# Patient Record
Sex: Male | Born: 1960
Health system: Southern US, Community
[De-identification: ages and names within clinical notes are randomized; demographics above are authoritative.]

## PROBLEM LIST (undated history)

## (undated) DIAGNOSIS — G459 Transient cerebral ischemic attack, unspecified: Secondary | ICD-10-CM

## (undated) DIAGNOSIS — I10 Essential (primary) hypertension: Secondary | ICD-10-CM

## (undated) HISTORY — PX: BACK SURGERY: SHX140

---

## 2011-12-04 DIAGNOSIS — G894 Chronic pain syndrome: Secondary | ICD-10-CM | POA: Diagnosis not present

## 2011-12-04 DIAGNOSIS — E785 Hyperlipidemia, unspecified: Secondary | ICD-10-CM | POA: Diagnosis not present

## 2012-03-11 DIAGNOSIS — G894 Chronic pain syndrome: Secondary | ICD-10-CM | POA: Diagnosis not present

## 2012-03-11 DIAGNOSIS — Z79899 Other long term (current) drug therapy: Secondary | ICD-10-CM | POA: Diagnosis not present

## 2012-03-11 DIAGNOSIS — M79609 Pain in unspecified limb: Secondary | ICD-10-CM | POA: Diagnosis not present

## 2012-06-17 DIAGNOSIS — J45909 Unspecified asthma, uncomplicated: Secondary | ICD-10-CM | POA: Diagnosis not present

## 2012-06-17 DIAGNOSIS — Z1212 Encounter for screening for malignant neoplasm of rectum: Secondary | ICD-10-CM | POA: Diagnosis not present

## 2012-06-17 DIAGNOSIS — E785 Hyperlipidemia, unspecified: Secondary | ICD-10-CM | POA: Diagnosis not present

## 2012-06-17 DIAGNOSIS — G894 Chronic pain syndrome: Secondary | ICD-10-CM | POA: Diagnosis not present

## 2012-09-21 DIAGNOSIS — G894 Chronic pain syndrome: Secondary | ICD-10-CM | POA: Diagnosis not present

## 2012-12-29 DIAGNOSIS — G894 Chronic pain syndrome: Secondary | ICD-10-CM | POA: Diagnosis not present

## 2013-04-06 DIAGNOSIS — Z79899 Other long term (current) drug therapy: Secondary | ICD-10-CM | POA: Diagnosis not present

## 2013-04-06 DIAGNOSIS — G894 Chronic pain syndrome: Secondary | ICD-10-CM | POA: Diagnosis not present

## 2013-07-13 DIAGNOSIS — F411 Generalized anxiety disorder: Secondary | ICD-10-CM | POA: Diagnosis not present

## 2013-07-13 DIAGNOSIS — Z1212 Encounter for screening for malignant neoplasm of rectum: Secondary | ICD-10-CM | POA: Diagnosis not present

## 2013-07-13 DIAGNOSIS — G894 Chronic pain syndrome: Secondary | ICD-10-CM | POA: Diagnosis not present

## 2013-07-13 DIAGNOSIS — I10 Essential (primary) hypertension: Secondary | ICD-10-CM | POA: Diagnosis not present

## 2013-07-13 DIAGNOSIS — F339 Major depressive disorder, recurrent, unspecified: Secondary | ICD-10-CM | POA: Diagnosis not present

## 2013-07-13 DIAGNOSIS — E785 Hyperlipidemia, unspecified: Secondary | ICD-10-CM | POA: Diagnosis not present

## 2013-10-12 DIAGNOSIS — G894 Chronic pain syndrome: Secondary | ICD-10-CM | POA: Diagnosis not present

## 2014-01-12 DIAGNOSIS — G894 Chronic pain syndrome: Secondary | ICD-10-CM | POA: Diagnosis not present

## 2014-01-12 DIAGNOSIS — I10 Essential (primary) hypertension: Secondary | ICD-10-CM | POA: Diagnosis not present

## 2014-04-20 DIAGNOSIS — G894 Chronic pain syndrome: Secondary | ICD-10-CM | POA: Diagnosis not present

## 2014-04-20 DIAGNOSIS — I10 Essential (primary) hypertension: Secondary | ICD-10-CM | POA: Diagnosis not present

## 2014-07-24 DIAGNOSIS — Z79899 Other long term (current) drug therapy: Secondary | ICD-10-CM | POA: Diagnosis not present

## 2014-07-24 DIAGNOSIS — J309 Allergic rhinitis, unspecified: Secondary | ICD-10-CM | POA: Diagnosis not present

## 2014-07-24 DIAGNOSIS — G894 Chronic pain syndrome: Secondary | ICD-10-CM | POA: Diagnosis not present

## 2014-08-16 DIAGNOSIS — J0100 Acute maxillary sinusitis, unspecified: Secondary | ICD-10-CM | POA: Diagnosis not present

## 2014-08-16 DIAGNOSIS — J309 Allergic rhinitis, unspecified: Secondary | ICD-10-CM | POA: Diagnosis not present

## 2014-10-23 DIAGNOSIS — G894 Chronic pain syndrome: Secondary | ICD-10-CM | POA: Diagnosis not present

## 2015-01-23 DIAGNOSIS — Z79899 Other long term (current) drug therapy: Secondary | ICD-10-CM | POA: Diagnosis not present

## 2015-01-23 DIAGNOSIS — Z1212 Encounter for screening for malignant neoplasm of rectum: Secondary | ICD-10-CM | POA: Diagnosis not present

## 2015-01-23 DIAGNOSIS — R3911 Hesitancy of micturition: Secondary | ICD-10-CM | POA: Diagnosis not present

## 2015-01-23 DIAGNOSIS — E785 Hyperlipidemia, unspecified: Secondary | ICD-10-CM | POA: Diagnosis not present

## 2015-01-23 DIAGNOSIS — K219 Gastro-esophageal reflux disease without esophagitis: Secondary | ICD-10-CM | POA: Diagnosis not present

## 2015-01-23 DIAGNOSIS — I10 Essential (primary) hypertension: Secondary | ICD-10-CM | POA: Diagnosis not present

## 2015-01-23 DIAGNOSIS — J302 Other seasonal allergic rhinitis: Secondary | ICD-10-CM | POA: Diagnosis not present

## 2015-01-23 DIAGNOSIS — G894 Chronic pain syndrome: Secondary | ICD-10-CM | POA: Diagnosis not present

## 2015-04-26 DIAGNOSIS — M549 Dorsalgia, unspecified: Secondary | ICD-10-CM | POA: Diagnosis not present

## 2015-04-26 DIAGNOSIS — Z79899 Other long term (current) drug therapy: Secondary | ICD-10-CM | POA: Diagnosis not present

## 2015-04-26 DIAGNOSIS — G894 Chronic pain syndrome: Secondary | ICD-10-CM | POA: Diagnosis not present

## 2015-07-30 DIAGNOSIS — Z79899 Other long term (current) drug therapy: Secondary | ICD-10-CM | POA: Diagnosis not present

## 2015-07-30 DIAGNOSIS — G894 Chronic pain syndrome: Secondary | ICD-10-CM | POA: Diagnosis not present

## 2015-07-30 DIAGNOSIS — M545 Low back pain: Secondary | ICD-10-CM | POA: Diagnosis not present

## 2015-07-30 DIAGNOSIS — E782 Mixed hyperlipidemia: Secondary | ICD-10-CM | POA: Diagnosis not present

## 2015-07-30 DIAGNOSIS — I10 Essential (primary) hypertension: Secondary | ICD-10-CM | POA: Diagnosis not present

## 2015-07-30 DIAGNOSIS — Z23 Encounter for immunization: Secondary | ICD-10-CM | POA: Diagnosis not present

## 2015-11-02 DIAGNOSIS — G894 Chronic pain syndrome: Secondary | ICD-10-CM | POA: Diagnosis not present

## 2015-11-02 DIAGNOSIS — Z79899 Other long term (current) drug therapy: Secondary | ICD-10-CM | POA: Diagnosis not present

## 2015-12-26 DIAGNOSIS — G894 Chronic pain syndrome: Secondary | ICD-10-CM | POA: Diagnosis not present

## 2015-12-26 DIAGNOSIS — Z79899 Other long term (current) drug therapy: Secondary | ICD-10-CM | POA: Diagnosis not present

## 2016-02-05 DIAGNOSIS — Z1212 Encounter for screening for malignant neoplasm of rectum: Secondary | ICD-10-CM | POA: Diagnosis not present

## 2016-02-05 DIAGNOSIS — M549 Dorsalgia, unspecified: Secondary | ICD-10-CM | POA: Diagnosis not present

## 2016-02-05 DIAGNOSIS — Z79891 Long term (current) use of opiate analgesic: Secondary | ICD-10-CM | POA: Diagnosis not present

## 2016-02-14 DIAGNOSIS — K219 Gastro-esophageal reflux disease without esophagitis: Secondary | ICD-10-CM | POA: Diagnosis not present

## 2016-03-14 DIAGNOSIS — D122 Benign neoplasm of ascending colon: Secondary | ICD-10-CM | POA: Diagnosis not present

## 2016-03-14 DIAGNOSIS — D126 Benign neoplasm of colon, unspecified: Secondary | ICD-10-CM | POA: Diagnosis not present

## 2016-03-14 DIAGNOSIS — D124 Benign neoplasm of descending colon: Secondary | ICD-10-CM | POA: Diagnosis not present

## 2016-03-14 DIAGNOSIS — Z1211 Encounter for screening for malignant neoplasm of colon: Secondary | ICD-10-CM | POA: Diagnosis not present

## 2016-03-14 DIAGNOSIS — Z800 Family history of malignant neoplasm of digestive organs: Secondary | ICD-10-CM | POA: Diagnosis not present

## 2016-05-29 DIAGNOSIS — Z9181 History of falling: Secondary | ICD-10-CM | POA: Diagnosis not present

## 2016-05-29 DIAGNOSIS — M549 Dorsalgia, unspecified: Secondary | ICD-10-CM | POA: Diagnosis not present

## 2016-05-29 DIAGNOSIS — Z1389 Encounter for screening for other disorder: Secondary | ICD-10-CM | POA: Diagnosis not present

## 2016-09-15 DIAGNOSIS — Z23 Encounter for immunization: Secondary | ICD-10-CM | POA: Diagnosis not present

## 2016-09-15 DIAGNOSIS — M549 Dorsalgia, unspecified: Secondary | ICD-10-CM | POA: Diagnosis not present

## 2016-12-22 DIAGNOSIS — Z0000 Encounter for general adult medical examination without abnormal findings: Secondary | ICD-10-CM | POA: Diagnosis not present

## 2016-12-22 DIAGNOSIS — Z79899 Other long term (current) drug therapy: Secondary | ICD-10-CM | POA: Diagnosis not present

## 2016-12-22 DIAGNOSIS — Z79891 Long term (current) use of opiate analgesic: Secondary | ICD-10-CM | POA: Diagnosis not present

## 2016-12-22 DIAGNOSIS — E785 Hyperlipidemia, unspecified: Secondary | ICD-10-CM | POA: Diagnosis not present

## 2016-12-22 DIAGNOSIS — M549 Dorsalgia, unspecified: Secondary | ICD-10-CM | POA: Diagnosis not present

## 2016-12-22 DIAGNOSIS — Z125 Encounter for screening for malignant neoplasm of prostate: Secondary | ICD-10-CM | POA: Diagnosis not present

## 2016-12-22 DIAGNOSIS — Z23 Encounter for immunization: Secondary | ICD-10-CM | POA: Diagnosis not present

## 2017-04-09 DIAGNOSIS — Z6830 Body mass index (BMI) 30.0-30.9, adult: Secondary | ICD-10-CM | POA: Diagnosis not present

## 2017-04-09 DIAGNOSIS — M549 Dorsalgia, unspecified: Secondary | ICD-10-CM | POA: Diagnosis not present

## 2017-04-09 DIAGNOSIS — Z79899 Other long term (current) drug therapy: Secondary | ICD-10-CM | POA: Diagnosis not present

## 2017-07-15 DIAGNOSIS — Z6829 Body mass index (BMI) 29.0-29.9, adult: Secondary | ICD-10-CM | POA: Diagnosis not present

## 2017-07-15 DIAGNOSIS — M549 Dorsalgia, unspecified: Secondary | ICD-10-CM | POA: Diagnosis not present

## 2017-10-16 DIAGNOSIS — Z23 Encounter for immunization: Secondary | ICD-10-CM | POA: Diagnosis not present

## 2017-10-16 DIAGNOSIS — Z1331 Encounter for screening for depression: Secondary | ICD-10-CM | POA: Diagnosis not present

## 2017-10-16 DIAGNOSIS — Z9181 History of falling: Secondary | ICD-10-CM | POA: Diagnosis not present

## 2018-01-14 DIAGNOSIS — Z6829 Body mass index (BMI) 29.0-29.9, adult: Secondary | ICD-10-CM | POA: Diagnosis not present

## 2018-01-14 DIAGNOSIS — M549 Dorsalgia, unspecified: Secondary | ICD-10-CM | POA: Diagnosis not present

## 2018-04-19 DIAGNOSIS — Z6829 Body mass index (BMI) 29.0-29.9, adult: Secondary | ICD-10-CM | POA: Diagnosis not present

## 2018-04-19 DIAGNOSIS — M549 Dorsalgia, unspecified: Secondary | ICD-10-CM | POA: Diagnosis not present

## 2018-08-12 DIAGNOSIS — Z6830 Body mass index (BMI) 30.0-30.9, adult: Secondary | ICD-10-CM | POA: Diagnosis not present

## 2018-08-12 DIAGNOSIS — M549 Dorsalgia, unspecified: Secondary | ICD-10-CM | POA: Diagnosis not present

## 2018-08-12 DIAGNOSIS — Z23 Encounter for immunization: Secondary | ICD-10-CM | POA: Diagnosis not present

## 2018-11-16 DIAGNOSIS — Z1331 Encounter for screening for depression: Secondary | ICD-10-CM | POA: Diagnosis not present

## 2018-11-16 DIAGNOSIS — J4520 Mild intermittent asthma, uncomplicated: Secondary | ICD-10-CM | POA: Diagnosis not present

## 2018-11-16 DIAGNOSIS — E785 Hyperlipidemia, unspecified: Secondary | ICD-10-CM | POA: Diagnosis not present

## 2018-11-16 DIAGNOSIS — Z9181 History of falling: Secondary | ICD-10-CM | POA: Diagnosis not present

## 2018-11-16 DIAGNOSIS — Z125 Encounter for screening for malignant neoplasm of prostate: Secondary | ICD-10-CM | POA: Diagnosis not present

## 2019-08-10 DIAGNOSIS — Z9181 History of falling: Secondary | ICD-10-CM | POA: Diagnosis not present

## 2019-08-10 DIAGNOSIS — Z125 Encounter for screening for malignant neoplasm of prostate: Secondary | ICD-10-CM | POA: Diagnosis not present

## 2019-08-10 DIAGNOSIS — Z1331 Encounter for screening for depression: Secondary | ICD-10-CM | POA: Diagnosis not present

## 2019-08-10 DIAGNOSIS — Z6831 Body mass index (BMI) 31.0-31.9, adult: Secondary | ICD-10-CM | POA: Diagnosis not present

## 2019-08-10 DIAGNOSIS — Z0000 Encounter for general adult medical examination without abnormal findings: Secondary | ICD-10-CM | POA: Diagnosis not present

## 2019-08-10 DIAGNOSIS — E785 Hyperlipidemia, unspecified: Secondary | ICD-10-CM | POA: Diagnosis not present

## 2019-11-19 DIAGNOSIS — Z20828 Contact with and (suspected) exposure to other viral communicable diseases: Secondary | ICD-10-CM | POA: Diagnosis not present

## 2019-11-19 DIAGNOSIS — J3489 Other specified disorders of nose and nasal sinuses: Secondary | ICD-10-CM | POA: Diagnosis not present

## 2019-11-21 DIAGNOSIS — J019 Acute sinusitis, unspecified: Secondary | ICD-10-CM | POA: Diagnosis not present

## 2019-11-21 DIAGNOSIS — U071 COVID-19: Secondary | ICD-10-CM | POA: Diagnosis not present

## 2021-01-25 DIAGNOSIS — R270 Ataxia, unspecified: Secondary | ICD-10-CM | POA: Diagnosis not present

## 2021-01-25 DIAGNOSIS — R531 Weakness: Secondary | ICD-10-CM | POA: Diagnosis not present

## 2021-01-25 DIAGNOSIS — R42 Dizziness and giddiness: Secondary | ICD-10-CM | POA: Diagnosis present

## 2021-01-25 DIAGNOSIS — I639 Cerebral infarction, unspecified: Secondary | ICD-10-CM | POA: Diagnosis not present

## 2021-01-25 DIAGNOSIS — R29701 NIHSS score 1: Secondary | ICD-10-CM | POA: Diagnosis present

## 2021-01-25 DIAGNOSIS — J45909 Unspecified asthma, uncomplicated: Secondary | ICD-10-CM | POA: Diagnosis present

## 2021-01-25 DIAGNOSIS — G459 Transient cerebral ischemic attack, unspecified: Secondary | ICD-10-CM | POA: Diagnosis present

## 2021-01-25 DIAGNOSIS — I6523 Occlusion and stenosis of bilateral carotid arteries: Secondary | ICD-10-CM | POA: Diagnosis not present

## 2021-01-25 DIAGNOSIS — R29700 NIHSS score 0: Secondary | ICD-10-CM | POA: Diagnosis present

## 2021-01-25 DIAGNOSIS — H532 Diplopia: Secondary | ICD-10-CM | POA: Diagnosis present

## 2021-01-26 DIAGNOSIS — G459 Transient cerebral ischemic attack, unspecified: Secondary | ICD-10-CM | POA: Diagnosis not present

## 2021-02-01 DIAGNOSIS — E785 Hyperlipidemia, unspecified: Secondary | ICD-10-CM | POA: Diagnosis not present

## 2021-02-01 DIAGNOSIS — Z79899 Other long term (current) drug therapy: Secondary | ICD-10-CM | POA: Diagnosis not present

## 2021-02-01 DIAGNOSIS — I10 Essential (primary) hypertension: Secondary | ICD-10-CM | POA: Diagnosis not present

## 2021-02-01 DIAGNOSIS — Z9181 History of falling: Secondary | ICD-10-CM | POA: Diagnosis not present

## 2021-02-01 DIAGNOSIS — G459 Transient cerebral ischemic attack, unspecified: Secondary | ICD-10-CM | POA: Diagnosis not present

## 2021-02-01 DIAGNOSIS — R079 Chest pain, unspecified: Secondary | ICD-10-CM | POA: Diagnosis not present

## 2021-02-01 DIAGNOSIS — R739 Hyperglycemia, unspecified: Secondary | ICD-10-CM | POA: Diagnosis not present

## 2021-02-01 DIAGNOSIS — J309 Allergic rhinitis, unspecified: Secondary | ICD-10-CM | POA: Diagnosis not present

## 2021-02-01 DIAGNOSIS — Z1331 Encounter for screening for depression: Secondary | ICD-10-CM | POA: Diagnosis not present

## 2021-02-01 DIAGNOSIS — R5383 Other fatigue: Secondary | ICD-10-CM | POA: Diagnosis not present

## 2021-02-01 DIAGNOSIS — R5381 Other malaise: Secondary | ICD-10-CM | POA: Diagnosis not present

## 2021-02-01 DIAGNOSIS — Z6832 Body mass index (BMI) 32.0-32.9, adult: Secondary | ICD-10-CM | POA: Diagnosis not present

## 2021-02-02 ENCOUNTER — Other Ambulatory Visit: Payer: Self-pay

## 2021-02-02 ENCOUNTER — Encounter (HOSPITAL_COMMUNITY): Payer: Self-pay

## 2021-02-02 ENCOUNTER — Emergency Department (HOSPITAL_COMMUNITY)
Admission: EM | Admit: 2021-02-02 | Discharge: 2021-02-02 | Disposition: A | Discharge status: Home / Self Care | Payer: Medicare Other | Attending: Emergency Medicine | Admitting: Emergency Medicine

## 2021-02-02 ENCOUNTER — Emergency Department (HOSPITAL_COMMUNITY): Payer: Medicare Other

## 2021-02-02 DIAGNOSIS — Z8673 Personal history of transient ischemic attack (TIA), and cerebral infarction without residual deficits: Secondary | ICD-10-CM | POA: Diagnosis not present

## 2021-02-02 DIAGNOSIS — R42 Dizziness and giddiness: Secondary | ICD-10-CM | POA: Insufficient documentation

## 2021-02-02 DIAGNOSIS — R61 Generalized hyperhidrosis: Secondary | ICD-10-CM | POA: Insufficient documentation

## 2021-02-02 DIAGNOSIS — R55 Syncope and collapse: Secondary | ICD-10-CM | POA: Diagnosis not present

## 2021-02-02 DIAGNOSIS — R519 Headache, unspecified: Secondary | ICD-10-CM | POA: Diagnosis not present

## 2021-02-02 DIAGNOSIS — I10 Essential (primary) hypertension: Secondary | ICD-10-CM | POA: Diagnosis not present

## 2021-02-02 DIAGNOSIS — R109 Unspecified abdominal pain: Secondary | ICD-10-CM | POA: Insufficient documentation

## 2021-02-02 HISTORY — DX: Transient cerebral ischemic attack, unspecified: G45.9

## 2021-02-02 HISTORY — DX: Essential (primary) hypertension: I10

## 2021-02-02 LAB — COMPREHENSIVE METABOLIC PANEL
ALT: 21 U/L (ref 0–44)
AST: 21 U/L (ref 15–41)
Albumin: 4.1 g/dL (ref 3.5–5.0)
Alkaline Phosphatase: 62 U/L (ref 38–126)
Anion gap: 9 (ref 5–15)
BUN: 13 mg/dL (ref 6–20)
CO2: 25 mmol/L (ref 22–32)
Calcium: 9.2 mg/dL (ref 8.9–10.3)
Chloride: 104 mmol/L (ref 98–111)
Creatinine, Ser: 0.9 mg/dL (ref 0.61–1.24)
GFR, Estimated: >60 mL/min (ref >60)
Glucose, Bld: 103 mg/dL — ABNORMAL HIGH (ref 70–99)
Potassium: 4.2 mmol/L (ref 3.5–5.1)
Sodium: 138 mmol/L (ref 135–145)
Total Bilirubin: 0.6 mg/dL (ref 0.3–1.2)
Total Protein: 7 g/dL (ref 6.5–8.1)

## 2021-02-02 LAB — URINALYSIS, ROUTINE W REFLEX MICROSCOPIC
Bilirubin Urine: NEGATIVE
Glucose, UA: NEGATIVE mg/dL
Hgb urine dipstick: NEGATIVE
Ketones, ur: NEGATIVE mg/dL
Leukocytes,Ua: NEGATIVE
Nitrite: NEGATIVE
Protein, ur: NEGATIVE mg/dL
Specific Gravity, Urine: 1.014 (ref 1.005–1.030)
pH: 5 (ref 5.0–8.0)

## 2021-02-02 LAB — TROPONIN I (HIGH SENSITIVITY)
Troponin I (High Sensitivity): 2 ng/L (ref <18)
Troponin I (High Sensitivity): 3 ng/L (ref <18)

## 2021-02-02 LAB — CBC
HCT: 49.9 % (ref 39.0–52.0)
Hemoglobin: 17.1 g/dL — ABNORMAL HIGH (ref 13.0–17.0)
MCH: 31.7 pg (ref 26.0–34.0)
MCHC: 34.3 g/dL (ref 30.0–36.0)
MCV: 92.6 fL (ref 80.0–100.0)
Platelets: 275 10*3/uL (ref 150–400)
RBC: 5.39 MIL/uL (ref 4.22–5.81)
RDW: 12.3 % (ref 11.5–15.5)
WBC: 4.6 10*3/uL (ref 4.0–10.5)
nRBC: 0 % (ref 0.0–0.2)

## 2021-02-02 MED ORDER — LORAZEPAM 2 MG/ML IJ SOLN
2.0000 mg | Freq: Once | INTRAMUSCULAR | Status: AC
  Administered 2021-02-02: 2 mg via INTRAVENOUS
  Filled 2021-02-02: qty 1

## 2021-02-02 NOTE — ED Triage Notes (Signed)
Pt reports that last Friday, became dizzy, and had trouble seeing. Went to [facility] and was diagnosed with a TIA - This morning around 0300 am started having nausea and became diaphoretic. BP was 174/102 at home. Nausea and sweating has resolved. Took a new BP prescription yesterday - olmesartan mepaxami.

## 2021-02-02 NOTE — ED Provider Notes (Signed)
[facility] EMERGENCY DEPARTMENT Provider Note   CSN: [id number] Arrival date & time: 02/02/21  0536     History No chief complaint on file.   Phillip Byrd is a 60 y.o. male.  Dizziness described as near syncopal versus vertiginous.  Went to outside facility was told he may have had a mini stroke but was unable to get MRI due to anxiety at the time.  Was sent home with antihypertensives and no other medications.  Still intermittently having symptoms.  Also accompanied with the symptoms are some abdominal discomfort diaphoresis.  Normal urination normal bowel movement.  No recent illness.  Intermittent headaches.  Mild to moderate.  Symptoms are better than they were but still intermittently persisting  The history is provided by the patient.       Past Medical History:  Diagnosis Date  . Hypertension   . TIA (transient ischemic attack)     There are no problems to display for this patient.   Past Surgical History:  Procedure Laterality Date  . BACK SURGERY         History reviewed. No pertinent family history.  Social History   Tobacco Use  . Smoking status: Never Smoker  . Smokeless tobacco: Never Used  Vaping Use  . Vaping Use: Never used  Substance Use Topics  . Alcohol use: Not Currently  . Drug use: Not Currently    Home Medications Prior to Admission medications   Not on File    Allergies    Codeine  Review of Systems   Review of Systems  Constitutional: Positive for chills and diaphoresis. Negative for fever.  HENT: Negative for congestion and rhinorrhea.   Respiratory: Negative for cough and shortness of breath.   Cardiovascular: Negative for chest pain and palpitations.  Gastrointestinal: Positive for abdominal pain. Negative for diarrhea, nausea and vomiting.  Genitourinary: Negative for difficulty urinating and dysuria.  Musculoskeletal: Negative for arthralgias and back pain.  Skin: Negative for color change and  rash.  Neurological: Positive for dizziness and headaches. Negative for light-headedness.    Physical Exam Updated Vital Signs BP 138/78   Pulse 74   Temp 98.1 F (36.7 C) (Oral)   Resp 19   Ht 5\' 11"  (1.803 m)   Wt 95.3 kg   SpO2 100%   BMI 29.29 kg/m   Physical Exam Vitals and nursing note reviewed. Exam conducted with a chaperone present.  Constitutional:      General: He is not in acute distress.    Appearance: Normal appearance.  HENT:     Head: Normocephalic and atraumatic.     Nose: No rhinorrhea.  Eyes:     General:        Right eye: No discharge.        Left eye: No discharge.     Conjunctiva/sclera: Conjunctivae normal.  Cardiovascular:     Rate and Rhythm: Normal rate and regular rhythm.  Pulmonary:     Effort: Pulmonary effort is normal.     Breath sounds: No stridor.  Abdominal:     General: Abdomen is flat. There is no distension.     Palpations: Abdomen is soft.  Musculoskeletal:        General: No deformity or signs of injury.  Skin:    General: Skin is warm and dry.  Neurological:     General: No focal deficit present.     Mental Status: He is alert. Mental status is at baseline.  Motor: No weakness.     Comments: 5 out of 5 motor strength in all extremities, sensation intact throughout, no dysmetria, no dysdiadochokinesia, no ataxia with ambulation, cranial nerves II through XII intact, alert and oriented to person place and time   Psychiatric:        Mood and Affect: Mood normal.        Behavior: Behavior normal.        Thought Content: Thought content normal.     ED Results / Procedures / Treatments   Labs (all labs ordered are listed, but only abnormal results are displayed) Labs Reviewed  COMPREHENSIVE METABOLIC PANEL - Abnormal; Notable for the following components:      Result Value   Glucose, Bld 103 (*)    All other components within normal limits  CBC - Abnormal; Notable for the following components:   Hemoglobin 17.1 (*)     All other components within normal limits  URINALYSIS, ROUTINE W REFLEX MICROSCOPIC  TROPONIN I (HIGH SENSITIVITY)  TROPONIN I (HIGH SENSITIVITY)    EKG None  Radiology MR BRAIN WO CONTRAST  Result Date: 02/02/2021 CLINICAL DATA:  TIA.  Dizziness. EXAM: MRI HEAD WITHOUT CONTRAST TECHNIQUE: Multiplanar, multiecho pulse sequences of the brain and surrounding structures were obtained without intravenous contrast. COMPARISON:  CTA of the head neck from 8 days ago FINDINGS: Brain: No acute infarction, hemorrhage, hydrocephalus, extra-axial collection or mass lesion. Age normal brain volume. Scant FLAIR hyperintensity in the white matter, non worrisome for age. Vascular: Normal flow voids. Skull and upper cervical spine: Normal marrow signal. Sinuses/Orbits: Negative. IMPRESSION: No acute or subacute infarct.  No explanation for symptoms. Electronically Signed   By: Monte Fantasia M.D.   On: 02/02/2021 09:36    Procedures Procedures   Medications Ordered in ED Medications  LORazepam (ATIVAN) injection 2 mg (2 mg Intravenous Given 02/02/21 6269)    ED Course  I have reviewed the triage vital signs and the nursing notes.  Pertinent labs & imaging results that were available during my care of the patient were reviewed by me and considered in my medical decision making (see chart for details).    MDM Rules/Calculators/A&P                          Recent work-up for TIA versus stroke.  Unable to get MRI.  Will get MRI here today.  Will get screening labs.  Still having intermittent symptoms of dizziness that are not provoked by position change.  Will get screening cardiac biomarkers EKG as well.  Patient is overall well-appearing with normal neurologic exam today.  Will be given Ativan for MRI.  MRI reviewed by radiology is unremarkable.  Patient remains hemodynamically stable.  Remains with a normal neurologic exam.  2 times troponins negative.  EKG is unremarkable.  He is overall  well-appearing and feels comfortable outpatient management.  Strict return precautions discussed.  No very obvious cause of dizziness now possibly peripheral.  Will follow up with Dr. And return as needed.  Final Clinical Impression(s) / ED Diagnoses Final diagnoses:  Dizziness    Rx / DC Orders ED Discharge Orders    None       Breck Coons, MD 02/02/21 1134

## 2021-02-02 NOTE — ED Notes (Signed)
Patient transported to MRI 

## 2021-02-04 ENCOUNTER — Other Ambulatory Visit: Payer: Self-pay | Admitting: Internal Medicine

## 2021-02-04 DIAGNOSIS — R42 Dizziness and giddiness: Secondary | ICD-10-CM | POA: Diagnosis not present

## 2021-02-04 DIAGNOSIS — G459 Transient cerebral ischemic attack, unspecified: Secondary | ICD-10-CM

## 2021-02-04 DIAGNOSIS — F411 Generalized anxiety disorder: Secondary | ICD-10-CM | POA: Diagnosis not present

## 2021-02-04 DIAGNOSIS — I10 Essential (primary) hypertension: Secondary | ICD-10-CM | POA: Diagnosis not present

## 2021-02-09 DIAGNOSIS — R42 Dizziness and giddiness: Secondary | ICD-10-CM | POA: Diagnosis not present

## 2021-02-11 DIAGNOSIS — R079 Chest pain, unspecified: Secondary | ICD-10-CM | POA: Diagnosis not present

## 2021-02-11 DIAGNOSIS — R110 Nausea: Secondary | ICD-10-CM | POA: Diagnosis not present

## 2021-02-11 DIAGNOSIS — Z23 Encounter for immunization: Secondary | ICD-10-CM | POA: Diagnosis not present

## 2021-02-11 DIAGNOSIS — R0602 Shortness of breath: Secondary | ICD-10-CM | POA: Diagnosis not present

## 2021-02-11 DIAGNOSIS — R61 Generalized hyperhidrosis: Secondary | ICD-10-CM | POA: Diagnosis not present

## 2021-02-11 DIAGNOSIS — I208 Other forms of angina pectoris: Secondary | ICD-10-CM | POA: Diagnosis not present

## 2021-02-11 DIAGNOSIS — I10 Essential (primary) hypertension: Secondary | ICD-10-CM | POA: Diagnosis not present

## 2021-02-11 DIAGNOSIS — R42 Dizziness and giddiness: Secondary | ICD-10-CM | POA: Diagnosis not present

## 2021-02-11 DIAGNOSIS — R1013 Epigastric pain: Secondary | ICD-10-CM | POA: Diagnosis not present

## 2021-02-11 DIAGNOSIS — K219 Gastro-esophageal reflux disease without esophagitis: Secondary | ICD-10-CM | POA: Diagnosis not present

## 2021-02-12 DIAGNOSIS — R0602 Shortness of breath: Secondary | ICD-10-CM | POA: Diagnosis not present

## 2021-02-12 DIAGNOSIS — R42 Dizziness and giddiness: Secondary | ICD-10-CM | POA: Diagnosis not present

## 2021-02-12 DIAGNOSIS — R079 Chest pain, unspecified: Secondary | ICD-10-CM | POA: Diagnosis not present

## 2021-02-14 DIAGNOSIS — Z79899 Other long term (current) drug therapy: Secondary | ICD-10-CM | POA: Diagnosis not present

## 2021-02-14 DIAGNOSIS — R42 Dizziness and giddiness: Secondary | ICD-10-CM | POA: Diagnosis not present

## 2021-02-14 DIAGNOSIS — I10 Essential (primary) hypertension: Secondary | ICD-10-CM | POA: Diagnosis not present

## 2021-02-14 DIAGNOSIS — R079 Chest pain, unspecified: Secondary | ICD-10-CM | POA: Diagnosis not present

## 2021-02-14 DIAGNOSIS — F411 Generalized anxiety disorder: Secondary | ICD-10-CM | POA: Diagnosis not present

## 2021-02-14 DIAGNOSIS — Z6831 Body mass index (BMI) 31.0-31.9, adult: Secondary | ICD-10-CM | POA: Diagnosis not present

## 2021-02-14 DIAGNOSIS — E785 Hyperlipidemia, unspecified: Secondary | ICD-10-CM | POA: Diagnosis not present

## 2021-02-18 DIAGNOSIS — F411 Generalized anxiety disorder: Secondary | ICD-10-CM | POA: Diagnosis not present

## 2021-02-18 DIAGNOSIS — I10 Essential (primary) hypertension: Secondary | ICD-10-CM | POA: Diagnosis not present

## 2021-02-28 DIAGNOSIS — R079 Chest pain, unspecified: Secondary | ICD-10-CM | POA: Diagnosis not present

## 2021-02-28 DIAGNOSIS — E782 Mixed hyperlipidemia: Secondary | ICD-10-CM | POA: Diagnosis not present

## 2021-03-01 DIAGNOSIS — R079 Chest pain, unspecified: Secondary | ICD-10-CM | POA: Diagnosis not present

## 2021-03-08 DIAGNOSIS — R42 Dizziness and giddiness: Secondary | ICD-10-CM | POA: Diagnosis not present

## 2021-03-08 DIAGNOSIS — I10 Essential (primary) hypertension: Secondary | ICD-10-CM | POA: Diagnosis not present

## 2021-03-08 DIAGNOSIS — Z6832 Body mass index (BMI) 32.0-32.9, adult: Secondary | ICD-10-CM | POA: Diagnosis not present

## 2021-03-08 DIAGNOSIS — F411 Generalized anxiety disorder: Secondary | ICD-10-CM | POA: Diagnosis not present

## 2021-03-19 DIAGNOSIS — Z6832 Body mass index (BMI) 32.0-32.9, adult: Secondary | ICD-10-CM | POA: Diagnosis not present

## 2021-03-19 DIAGNOSIS — L02415 Cutaneous abscess of right lower limb: Secondary | ICD-10-CM | POA: Diagnosis not present

## 2021-03-28 DIAGNOSIS — L02415 Cutaneous abscess of right lower limb: Secondary | ICD-10-CM | POA: Diagnosis not present

## 2021-05-14 DIAGNOSIS — I10 Essential (primary) hypertension: Secondary | ICD-10-CM | POA: Diagnosis not present

## 2021-05-14 DIAGNOSIS — Z1211 Encounter for screening for malignant neoplasm of colon: Secondary | ICD-10-CM | POA: Diagnosis not present

## 2021-05-14 DIAGNOSIS — E785 Hyperlipidemia, unspecified: Secondary | ICD-10-CM | POA: Diagnosis not present

## 2021-05-14 DIAGNOSIS — F411 Generalized anxiety disorder: Secondary | ICD-10-CM | POA: Diagnosis not present

## 2021-05-14 DIAGNOSIS — Z6832 Body mass index (BMI) 32.0-32.9, adult: Secondary | ICD-10-CM | POA: Diagnosis not present

## 2021-05-14 DIAGNOSIS — R42 Dizziness and giddiness: Secondary | ICD-10-CM | POA: Diagnosis not present

## 2021-05-14 DIAGNOSIS — L02415 Cutaneous abscess of right lower limb: Secondary | ICD-10-CM | POA: Diagnosis not present

## 2021-05-17 DIAGNOSIS — E785 Hyperlipidemia, unspecified: Secondary | ICD-10-CM | POA: Diagnosis not present

## 2021-05-17 DIAGNOSIS — I10 Essential (primary) hypertension: Secondary | ICD-10-CM | POA: Diagnosis not present

## 2021-08-26 DIAGNOSIS — F411 Generalized anxiety disorder: Secondary | ICD-10-CM | POA: Diagnosis not present

## 2021-08-26 DIAGNOSIS — E785 Hyperlipidemia, unspecified: Secondary | ICD-10-CM | POA: Diagnosis not present

## 2021-08-26 DIAGNOSIS — I10 Essential (primary) hypertension: Secondary | ICD-10-CM | POA: Diagnosis not present

## 2021-08-26 DIAGNOSIS — Z6831 Body mass index (BMI) 31.0-31.9, adult: Secondary | ICD-10-CM | POA: Diagnosis not present

## 2022-02-21 DIAGNOSIS — Z1331 Encounter for screening for depression: Secondary | ICD-10-CM | POA: Diagnosis not present

## 2022-02-21 DIAGNOSIS — I10 Essential (primary) hypertension: Secondary | ICD-10-CM | POA: Diagnosis not present

## 2022-02-21 DIAGNOSIS — J4520 Mild intermittent asthma, uncomplicated: Secondary | ICD-10-CM | POA: Diagnosis not present

## 2022-02-21 DIAGNOSIS — E785 Hyperlipidemia, unspecified: Secondary | ICD-10-CM | POA: Diagnosis not present

## 2022-02-21 DIAGNOSIS — F411 Generalized anxiety disorder: Secondary | ICD-10-CM | POA: Diagnosis not present

## 2022-08-25 DIAGNOSIS — M549 Dorsalgia, unspecified: Secondary | ICD-10-CM | POA: Diagnosis not present

## 2022-08-25 DIAGNOSIS — J4520 Mild intermittent asthma, uncomplicated: Secondary | ICD-10-CM | POA: Diagnosis not present

## 2022-08-25 DIAGNOSIS — I10 Essential (primary) hypertension: Secondary | ICD-10-CM | POA: Diagnosis not present

## 2022-08-25 DIAGNOSIS — L739 Follicular disorder, unspecified: Secondary | ICD-10-CM | POA: Diagnosis not present

## 2022-08-25 DIAGNOSIS — G8929 Other chronic pain: Secondary | ICD-10-CM | POA: Diagnosis not present

## 2022-08-25 DIAGNOSIS — E785 Hyperlipidemia, unspecified: Secondary | ICD-10-CM | POA: Diagnosis not present

## 2022-10-28 DIAGNOSIS — J019 Acute sinusitis, unspecified: Secondary | ICD-10-CM | POA: Diagnosis not present

## 2022-12-15 DIAGNOSIS — M79604 Pain in right leg: Secondary | ICD-10-CM | POA: Diagnosis not present

## 2022-12-15 DIAGNOSIS — S8011XA Contusion of right lower leg, initial encounter: Secondary | ICD-10-CM | POA: Diagnosis not present

## 2022-12-28 IMAGING — MR MR HEAD W/O CM
12 of 13 series · 44 of 48 positions shown · non-contrast
Comparison: CTA of the head neck from 8 days ago

CLINICAL DATA: TIA.  Dizziness.

EXAM:
MRI HEAD WITHOUT CONTRAST
TECHNIQUE: Multiplanar, multiecho pulse sequences of the brain and surrounding
structures were obtained without intravenous contrast.

[Series 5: DWI · axial · 3.0mm · 0.88mm/px · z∈[-113,+32]mm · 8 of 100 slices shown (1 of 4)]
[im 1/100]
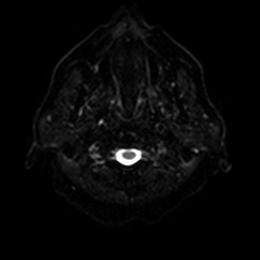
[im 15/100]
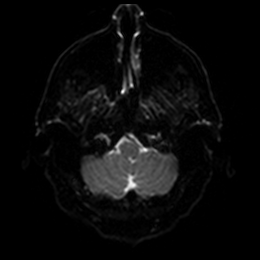
[im 29/100]
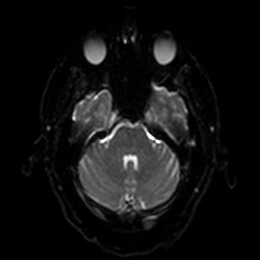
[im 43/100]
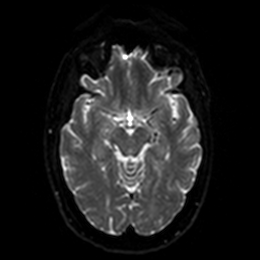
[im 57/100]
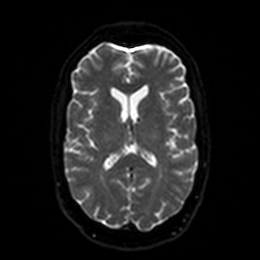
[im 71/100]
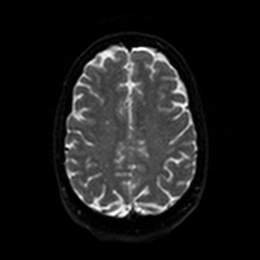
[im 85/100]
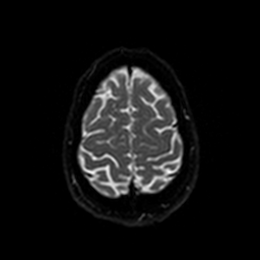
[im 100/100]
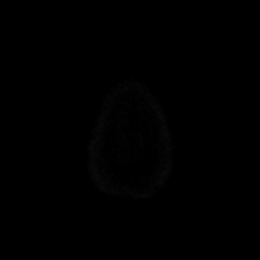

[Series 6: DWI · axial · 3.0mm · 0.88mm/px · z∈[-113,+32]mm · 4 of 50 slices shown (2 of 4)]
[im 1/50]
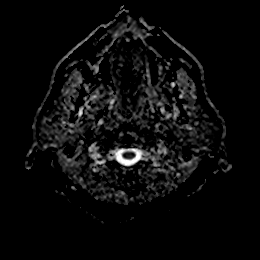
[im 17/50]
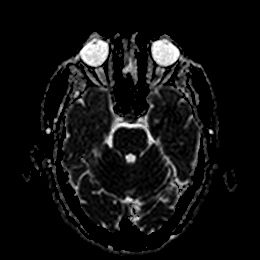
[im 33/50]
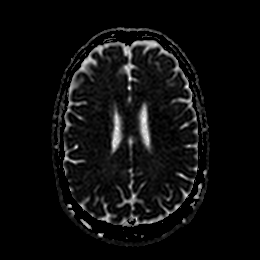
[im 50/50]
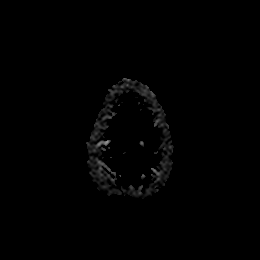

[Series 7: DWI · coronal · 4.0mm · 0.88mm/px · 5 of 68 slices shown (3 of 4)]
[im 1/68]
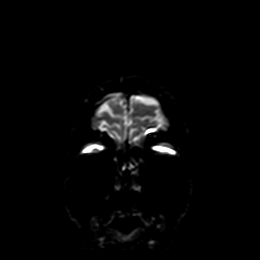
[im 17/68]
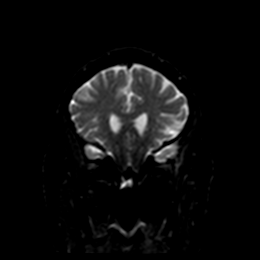
[im 34/68]
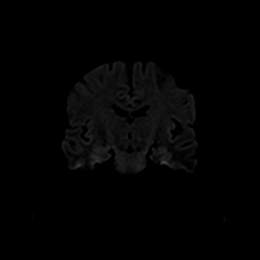
[im 51/68]
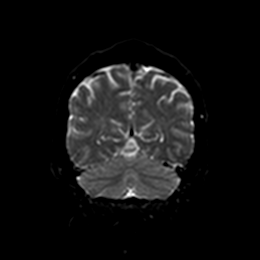
[im 68/68]
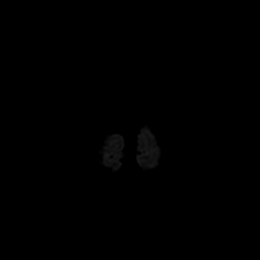

[Series 8: DWI · coronal · 4.0mm · 0.88mm/px · 3 of 34 slices shown (4 of 4)]
[im 1/34]
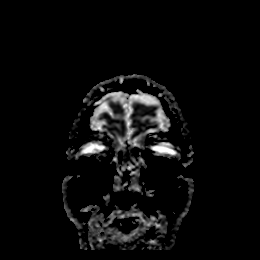
[im 17/34]
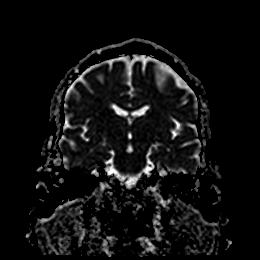
[im 34/34]
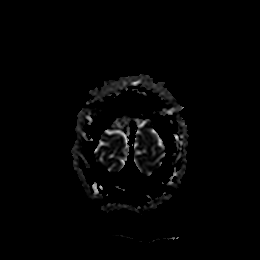

[Series 9: T1 · sagittal · 5.0mm · 0.75mm/px · 2 of 23 slices shown]
[im 1/23]
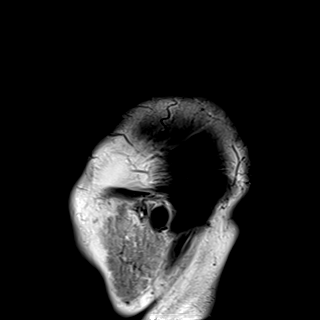
[im 23/23]
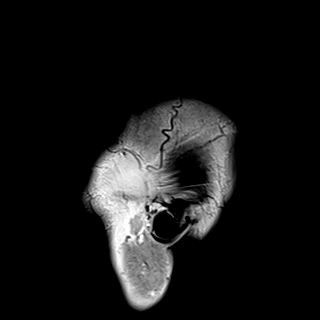

[Series 10: T2 · axial · 5.0mm · 0.72mm/px · z∈[-129,+22]mm · 2 of 27 slices shown (1 of 2)]
[im 1/27]
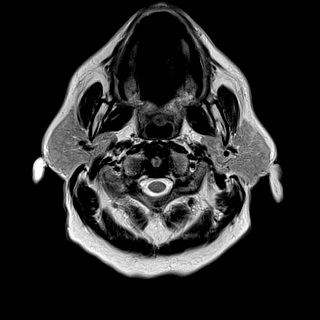
[im 27/27]
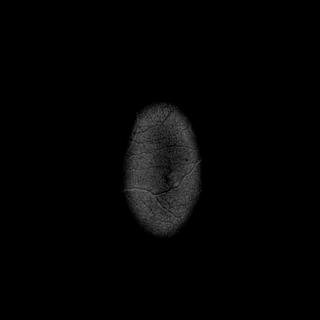

[Series 11: FLAIR · axial · 5.0mm · 0.45mm/px · z∈[-129,+22]mm · 2 of 27 slices shown]
[im 1/27]
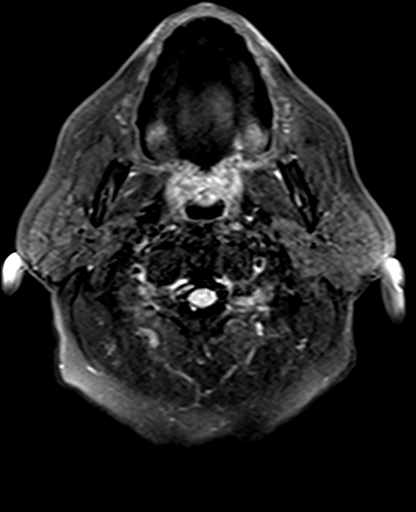
[im 27/27]
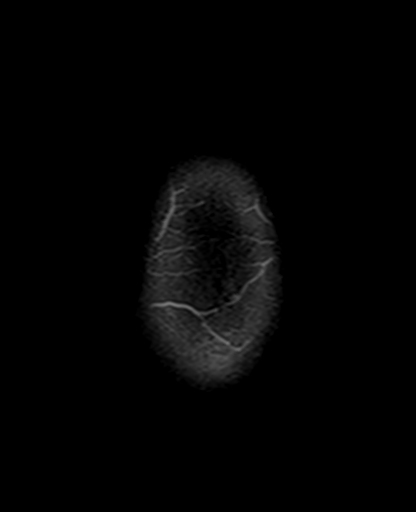

[Series 12: mag_images · axial · 3.0mm · 0.90mm/px · z∈[-131,+29]mm · 4 of 56 slices shown]
[im 1/56]
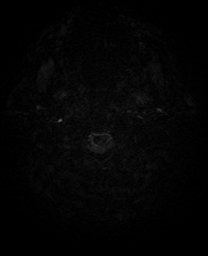
[im 19/56]
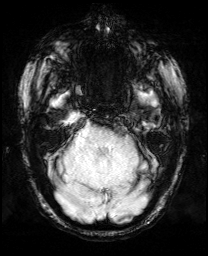
[im 37/56]
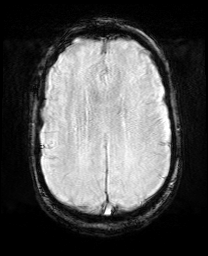
[im 56/56]
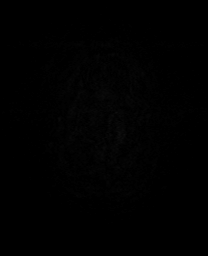

[Series 13: pha_images · axial · 3.0mm · 0.90mm/px · z∈[-128,+26]mm · 4 of 54 slices shown]
[im 1/54]
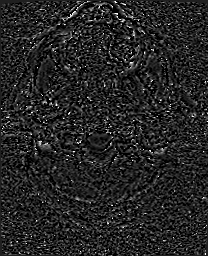
[im 18/54]
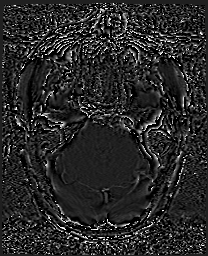
[im 36/54]
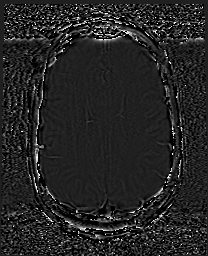
[im 54/54]
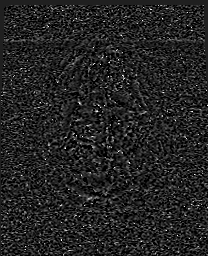

[Series 14: swi_images · axial · 3.0mm · 0.90mm/px · z∈[-131,+29]mm · 4 of 56 slices shown]
[im 1/56]
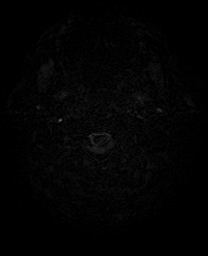
[im 19/56]
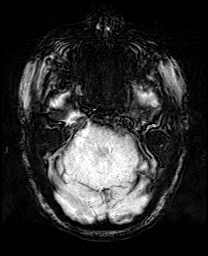
[im 37/56]
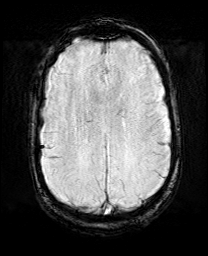
[im 56/56]
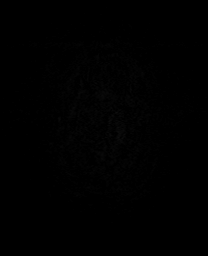

[Series 15: mip_images(sw) · axial · 24.0mm · 0.90mm/px · z∈[-120,+19]mm · 4 of 49 slices shown]
[im 1/49]
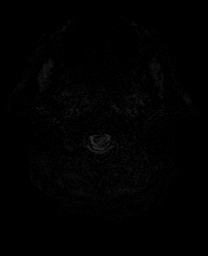
[im 17/49]
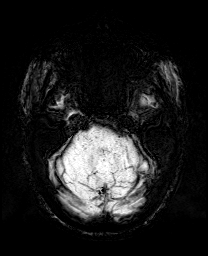
[im 33/49]
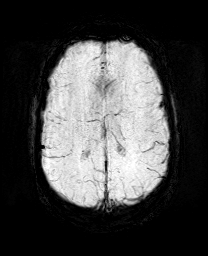
[im 49/49]
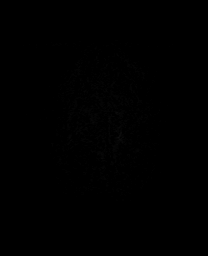

[Series 17: T2 · coronal · 5.0mm · 0.34mm/px · 2 of 29 slices shown (2 of 2)]
[im 1/29]
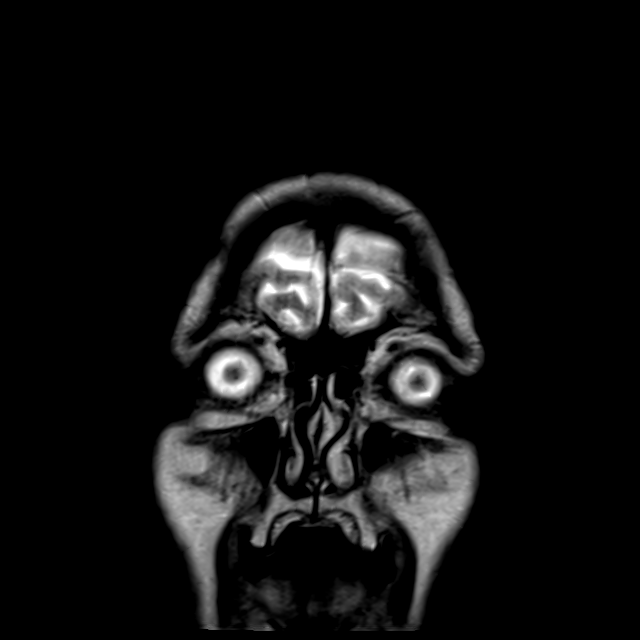
[im 29/29]
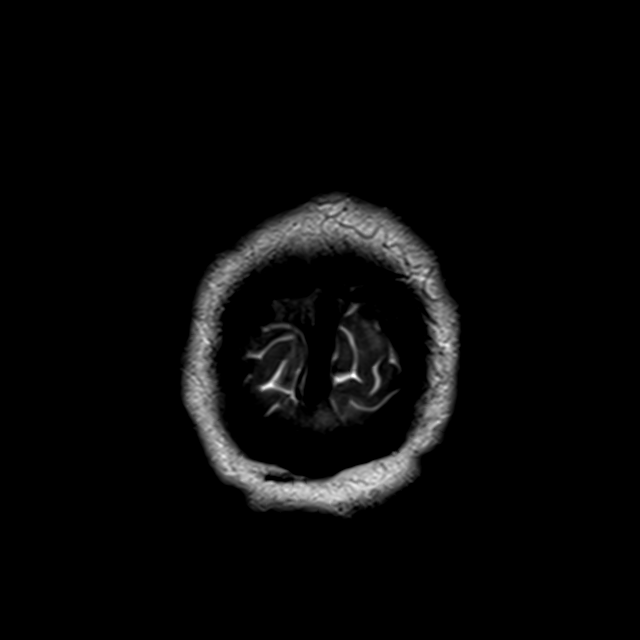

[44 of 48 positions shown; findings below may reference images not displayed]

FINDINGS: Brain: No acute infarction, hemorrhage, hydrocephalus, extra-axial
collection or mass lesion. Age normal brain volume. Scant FLAIR
hyperintensity in the white matter, non worrisome for age.

Vascular: Normal flow voids.

Skull and upper cervical spine: Normal marrow signal.

Sinuses/Orbits: Negative.
IMPRESSION: No acute or subacute infarct.  No explanation for symptoms.

## 2023-02-23 DIAGNOSIS — G8929 Other chronic pain: Secondary | ICD-10-CM | POA: Diagnosis not present

## 2023-02-23 DIAGNOSIS — Z6832 Body mass index (BMI) 32.0-32.9, adult: Secondary | ICD-10-CM | POA: Diagnosis not present

## 2023-02-23 DIAGNOSIS — I10 Essential (primary) hypertension: Secondary | ICD-10-CM | POA: Diagnosis not present

## 2023-02-23 DIAGNOSIS — E785 Hyperlipidemia, unspecified: Secondary | ICD-10-CM | POA: Diagnosis not present

## 2023-02-23 DIAGNOSIS — J4520 Mild intermittent asthma, uncomplicated: Secondary | ICD-10-CM | POA: Diagnosis not present

## 2023-02-23 DIAGNOSIS — M549 Dorsalgia, unspecified: Secondary | ICD-10-CM | POA: Diagnosis not present

## 2023-08-25 DIAGNOSIS — I10 Essential (primary) hypertension: Secondary | ICD-10-CM | POA: Diagnosis not present

## 2023-08-25 DIAGNOSIS — Z23 Encounter for immunization: Secondary | ICD-10-CM | POA: Diagnosis not present

## 2023-08-25 DIAGNOSIS — Z6832 Body mass index (BMI) 32.0-32.9, adult: Secondary | ICD-10-CM | POA: Diagnosis not present

## 2023-08-25 DIAGNOSIS — E785 Hyperlipidemia, unspecified: Secondary | ICD-10-CM | POA: Diagnosis not present

## 2023-08-25 DIAGNOSIS — J4520 Mild intermittent asthma, uncomplicated: Secondary | ICD-10-CM | POA: Diagnosis not present

## 2023-08-25 DIAGNOSIS — M549 Dorsalgia, unspecified: Secondary | ICD-10-CM | POA: Diagnosis not present

## 2023-08-25 DIAGNOSIS — Z125 Encounter for screening for malignant neoplasm of prostate: Secondary | ICD-10-CM | POA: Diagnosis not present

## 2023-08-25 DIAGNOSIS — G8929 Other chronic pain: Secondary | ICD-10-CM | POA: Diagnosis not present

## 2023-08-25 DIAGNOSIS — E66811 Obesity, class 1: Secondary | ICD-10-CM | POA: Diagnosis not present

## 2024-02-23 DIAGNOSIS — K5900 Constipation, unspecified: Secondary | ICD-10-CM | POA: Diagnosis not present

## 2024-02-23 DIAGNOSIS — Z9181 History of falling: Secondary | ICD-10-CM | POA: Diagnosis not present

## 2024-02-23 DIAGNOSIS — E66811 Obesity, class 1: Secondary | ICD-10-CM | POA: Diagnosis not present

## 2024-02-23 DIAGNOSIS — Z6833 Body mass index (BMI) 33.0-33.9, adult: Secondary | ICD-10-CM | POA: Diagnosis not present

## 2024-02-23 DIAGNOSIS — G8929 Other chronic pain: Secondary | ICD-10-CM | POA: Diagnosis not present

## 2024-02-23 DIAGNOSIS — E6609 Other obesity due to excess calories: Secondary | ICD-10-CM | POA: Diagnosis not present

## 2024-02-23 DIAGNOSIS — E785 Hyperlipidemia, unspecified: Secondary | ICD-10-CM | POA: Diagnosis not present

## 2024-02-23 DIAGNOSIS — J4520 Mild intermittent asthma, uncomplicated: Secondary | ICD-10-CM | POA: Diagnosis not present

## 2024-02-23 DIAGNOSIS — J309 Allergic rhinitis, unspecified: Secondary | ICD-10-CM | POA: Diagnosis not present

## 2024-02-23 DIAGNOSIS — Z0000 Encounter for general adult medical examination without abnormal findings: Secondary | ICD-10-CM | POA: Diagnosis not present

## 2024-02-23 DIAGNOSIS — M549 Dorsalgia, unspecified: Secondary | ICD-10-CM | POA: Diagnosis not present

## 2024-02-23 DIAGNOSIS — I10 Essential (primary) hypertension: Secondary | ICD-10-CM | POA: Diagnosis not present

## 2024-07-29 DIAGNOSIS — M5412 Radiculopathy, cervical region: Secondary | ICD-10-CM | POA: Diagnosis not present

## 2024-07-29 DIAGNOSIS — I10 Essential (primary) hypertension: Secondary | ICD-10-CM | POA: Diagnosis not present

## 2024-08-29 DIAGNOSIS — Z23 Encounter for immunization: Secondary | ICD-10-CM | POA: Diagnosis not present

## 2024-08-29 DIAGNOSIS — J4520 Mild intermittent asthma, uncomplicated: Secondary | ICD-10-CM | POA: Diagnosis not present

## 2024-08-29 DIAGNOSIS — M549 Dorsalgia, unspecified: Secondary | ICD-10-CM | POA: Diagnosis not present

## 2024-08-29 DIAGNOSIS — K5900 Constipation, unspecified: Secondary | ICD-10-CM | POA: Diagnosis not present

## 2024-08-29 DIAGNOSIS — E6609 Other obesity due to excess calories: Secondary | ICD-10-CM | POA: Diagnosis not present

## 2024-08-29 DIAGNOSIS — I10 Essential (primary) hypertension: Secondary | ICD-10-CM | POA: Diagnosis not present

## 2024-08-29 DIAGNOSIS — M5412 Radiculopathy, cervical region: Secondary | ICD-10-CM | POA: Diagnosis not present

## 2024-08-29 DIAGNOSIS — Z125 Encounter for screening for malignant neoplasm of prostate: Secondary | ICD-10-CM | POA: Diagnosis not present

## 2024-08-29 DIAGNOSIS — G8929 Other chronic pain: Secondary | ICD-10-CM | POA: Diagnosis not present

## 2024-08-29 DIAGNOSIS — Z6833 Body mass index (BMI) 33.0-33.9, adult: Secondary | ICD-10-CM | POA: Diagnosis not present

## 2024-08-29 DIAGNOSIS — E785 Hyperlipidemia, unspecified: Secondary | ICD-10-CM | POA: Diagnosis not present

## 2024-08-29 DIAGNOSIS — E66811 Obesity, class 1: Secondary | ICD-10-CM | POA: Diagnosis not present

## 2024-09-05 DIAGNOSIS — M1711 Unilateral primary osteoarthritis, right knee: Secondary | ICD-10-CM | POA: Diagnosis not present

## 2024-09-05 DIAGNOSIS — M009 Pyogenic arthritis, unspecified: Secondary | ICD-10-CM | POA: Diagnosis not present

## 2024-09-05 DIAGNOSIS — M25561 Pain in right knee: Secondary | ICD-10-CM | POA: Diagnosis not present

## 2024-09-08 DIAGNOSIS — M7041 Prepatellar bursitis, right knee: Secondary | ICD-10-CM | POA: Diagnosis not present

## 2024-09-15 DIAGNOSIS — A4902 Methicillin resistant Staphylococcus aureus infection, unspecified site: Secondary | ICD-10-CM | POA: Diagnosis not present

## 2024-09-15 DIAGNOSIS — M7051 Other bursitis of knee, right knee: Secondary | ICD-10-CM | POA: Diagnosis not present

## 2024-09-15 DIAGNOSIS — R21 Rash and other nonspecific skin eruption: Secondary | ICD-10-CM | POA: Diagnosis not present

## 2024-10-03 DIAGNOSIS — R972 Elevated prostate specific antigen [PSA]: Secondary | ICD-10-CM | POA: Diagnosis not present

## 2024-11-22 ENCOUNTER — Other Ambulatory Visit (HOSPITAL_COMMUNITY): Payer: Self-pay | Admitting: Urology

## 2024-11-22 DIAGNOSIS — R972 Elevated prostate specific antigen [PSA]: Secondary | ICD-10-CM

## 2024-12-06 ENCOUNTER — Ambulatory Visit (HOSPITAL_COMMUNITY)
